# Patient Record
Sex: Female | Born: 1935 | Race: White | Hispanic: No | Marital: Married | Smoking: Never smoker
Health system: Southern US, Community
[De-identification: ages and names within clinical notes are randomized; demographics above are authoritative.]

## PROBLEM LIST (undated history)

## (undated) DIAGNOSIS — M75101 Unspecified rotator cuff tear or rupture of right shoulder, not specified as traumatic: Secondary | ICD-10-CM

## (undated) DIAGNOSIS — I2510 Atherosclerotic heart disease of native coronary artery without angina pectoris: Secondary | ICD-10-CM

## (undated) DIAGNOSIS — I714 Abdominal aortic aneurysm, without rupture, unspecified: Secondary | ICD-10-CM

## (undated) DIAGNOSIS — E785 Hyperlipidemia, unspecified: Secondary | ICD-10-CM

## (undated) DIAGNOSIS — I10 Essential (primary) hypertension: Secondary | ICD-10-CM

## (undated) HISTORY — PX: CORONARY ARTERY BYPASS GRAFT: SHX141

---

## 2004-09-11 ENCOUNTER — Ambulatory Visit: Payer: Self-pay | Admitting: Cardiology

## 2004-10-30 ENCOUNTER — Encounter: Payer: Self-pay | Admitting: Cardiology

## 2004-11-18 ENCOUNTER — Encounter: Payer: Self-pay | Admitting: Cardiology

## 2004-12-19 ENCOUNTER — Encounter: Payer: Self-pay | Admitting: Cardiology

## 2005-01-18 ENCOUNTER — Encounter: Payer: Self-pay | Admitting: Cardiology

## 2005-09-23 ENCOUNTER — Ambulatory Visit: Payer: Self-pay | Admitting: Obstetrics and Gynecology

## 2005-09-25 ENCOUNTER — Ambulatory Visit: Payer: Self-pay | Admitting: Obstetrics and Gynecology

## 2005-10-10 ENCOUNTER — Ambulatory Visit: Payer: Self-pay | Admitting: General Surgery

## 2005-10-24 ENCOUNTER — Ambulatory Visit: Payer: Self-pay | Admitting: General Surgery

## 2005-10-24 ENCOUNTER — Other Ambulatory Visit: Payer: Self-pay

## 2005-10-28 ENCOUNTER — Ambulatory Visit: Payer: Self-pay | Admitting: General Surgery

## 2005-11-06 ENCOUNTER — Ambulatory Visit: Payer: Self-pay | Admitting: Internal Medicine

## 2005-11-27 ENCOUNTER — Ambulatory Visit: Payer: Self-pay | Admitting: Oncology

## 2005-12-19 ENCOUNTER — Ambulatory Visit: Payer: Self-pay | Admitting: Oncology

## 2006-01-18 ENCOUNTER — Ambulatory Visit: Payer: Self-pay | Admitting: Oncology

## 2006-02-18 ENCOUNTER — Ambulatory Visit: Payer: Self-pay | Admitting: Oncology

## 2006-03-21 ENCOUNTER — Ambulatory Visit: Payer: Self-pay | Admitting: Oncology

## 2006-05-14 ENCOUNTER — Ambulatory Visit: Payer: Self-pay | Admitting: General Surgery

## 2006-05-20 ENCOUNTER — Ambulatory Visit: Payer: Self-pay | Admitting: Oncology

## 2006-05-26 ENCOUNTER — Ambulatory Visit: Payer: Self-pay | Admitting: Oncology

## 2006-06-19 ENCOUNTER — Ambulatory Visit: Payer: Self-pay | Admitting: Oncology

## 2006-08-19 ENCOUNTER — Ambulatory Visit: Payer: Self-pay | Admitting: Radiation Oncology

## 2006-09-18 ENCOUNTER — Ambulatory Visit: Payer: Self-pay | Admitting: Radiation Oncology

## 2006-09-19 ENCOUNTER — Ambulatory Visit: Payer: Self-pay | Admitting: Oncology

## 2006-09-22 ENCOUNTER — Ambulatory Visit: Payer: Self-pay | Admitting: Oncology

## 2006-10-20 ENCOUNTER — Ambulatory Visit: Payer: Self-pay | Admitting: Radiation Oncology

## 2006-10-20 ENCOUNTER — Ambulatory Visit: Payer: Self-pay | Admitting: Oncology

## 2006-11-19 ENCOUNTER — Ambulatory Visit: Payer: Self-pay | Admitting: Radiation Oncology

## 2006-11-19 ENCOUNTER — Ambulatory Visit: Payer: Self-pay | Admitting: Oncology

## 2006-12-20 ENCOUNTER — Ambulatory Visit: Payer: Self-pay | Admitting: Oncology

## 2006-12-26 ENCOUNTER — Ambulatory Visit: Payer: Self-pay | Admitting: Oncology

## 2007-01-19 ENCOUNTER — Ambulatory Visit: Payer: Self-pay | Admitting: Oncology

## 2007-01-20 ENCOUNTER — Ambulatory Visit: Payer: Self-pay | Admitting: Otolaryngology

## 2007-01-22 ENCOUNTER — Inpatient Hospital Stay: Payer: Self-pay | Admitting: Otolaryngology

## 2007-07-01 ENCOUNTER — Ambulatory Visit: Payer: Self-pay | Admitting: Oncology

## 2007-07-20 ENCOUNTER — Ambulatory Visit: Payer: Self-pay | Admitting: Oncology

## 2007-08-19 ENCOUNTER — Ambulatory Visit: Payer: Self-pay | Admitting: Oncology

## 2007-09-17 ENCOUNTER — Ambulatory Visit: Payer: Self-pay | Admitting: Oncology

## 2007-09-19 ENCOUNTER — Ambulatory Visit: Payer: Self-pay | Admitting: Oncology

## 2007-11-16 ENCOUNTER — Ambulatory Visit: Payer: Self-pay | Admitting: Oncology

## 2007-11-19 ENCOUNTER — Ambulatory Visit: Payer: Self-pay | Admitting: Oncology

## 2008-02-19 ENCOUNTER — Ambulatory Visit: Payer: Self-pay | Admitting: Oncology

## 2008-02-19 IMAGING — CR DG CHEST 1V
1 series · 1 of 1 positions shown · non-contrast
Comparison: none

REASON FOR EXAM: Post RIGHT breast biopsy upright EX Dr. Jhemboy
COMMENTS:

PROCEDURE:     DXR - DXR CHEST 1 VIEWAP OR PA  - October 10, 2005  [DATE]
RESULT:
HISTORY: Post RIGHT breast biopsy.

[view not recorded]
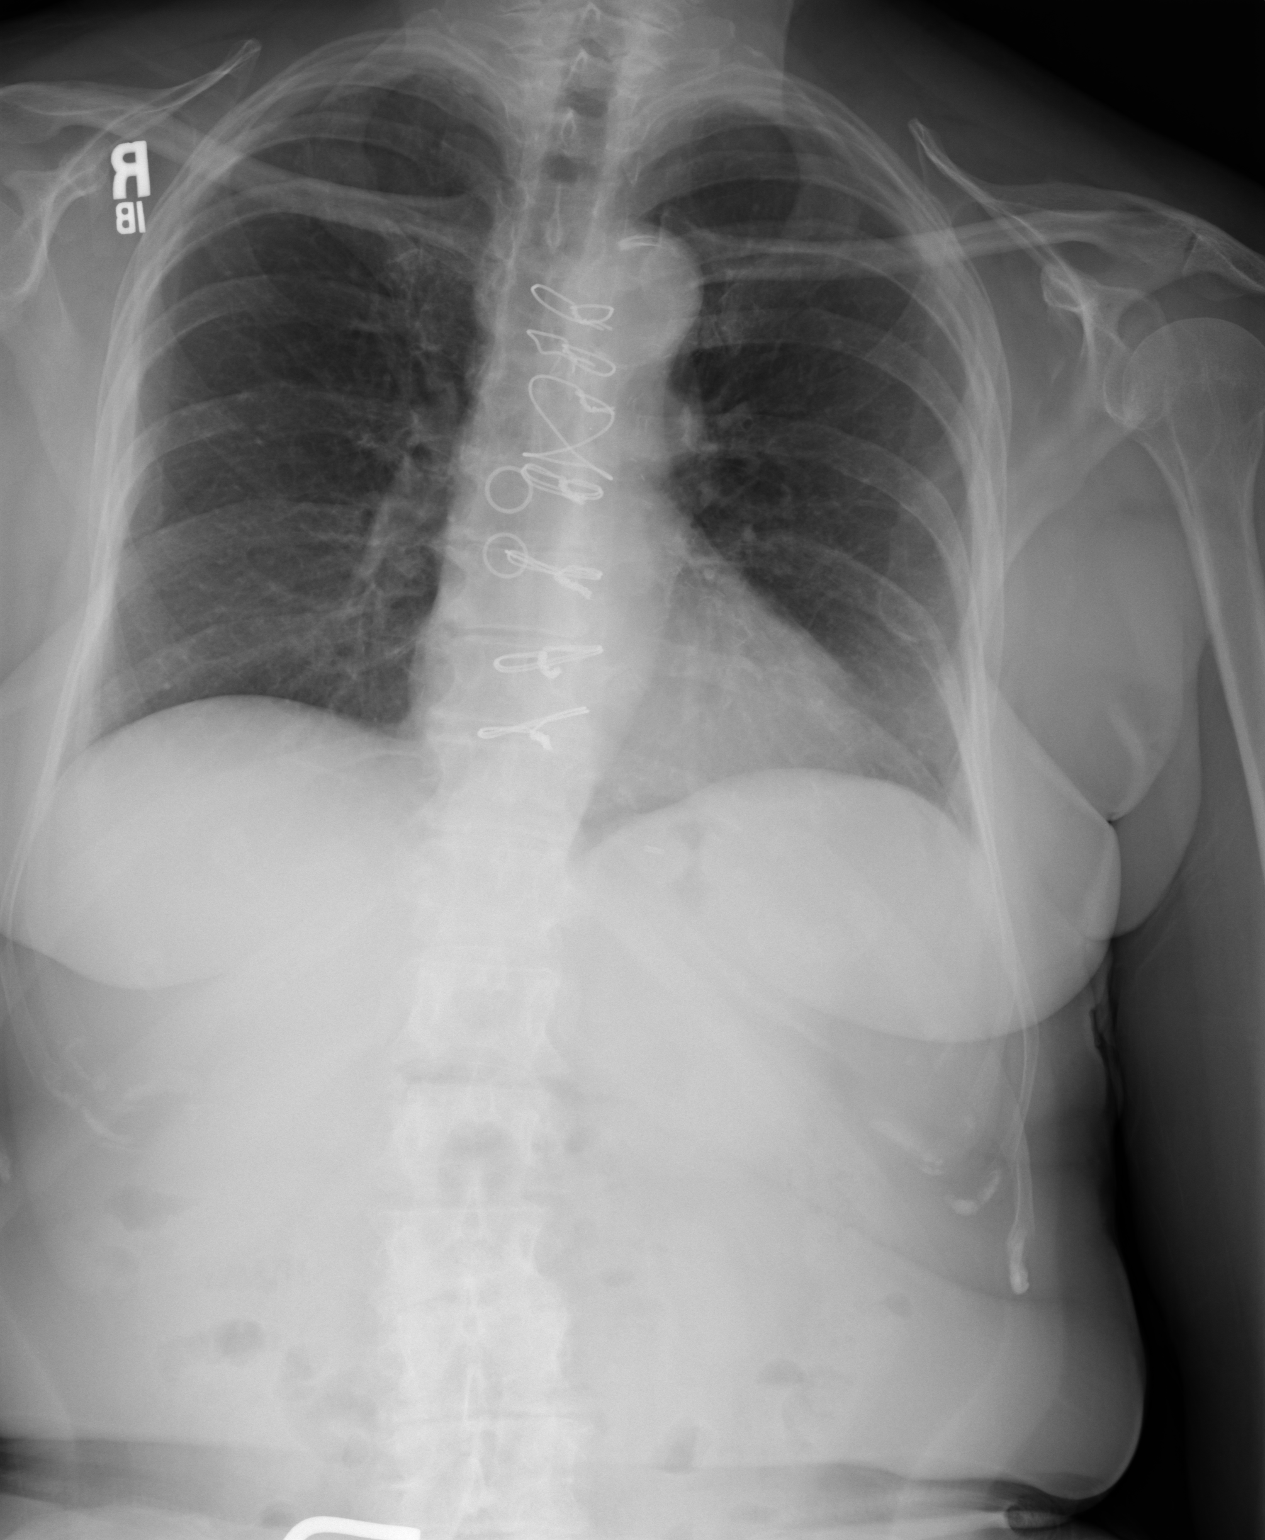

[1 of 1 positions shown; findings below may reference images not displayed]

FINDINGS: No evidence of pneumothorax post breast biopsy.  Chest x-ray was
obtained as the biopsy was performed to a breast lesion along the chest
wall.  The patient has had a prior CABG.
IMPRESSION: No evidence of pneumothorax post breast biopsy.

## 2008-02-29 ENCOUNTER — Ambulatory Visit: Payer: Self-pay | Admitting: Oncology

## 2008-03-21 ENCOUNTER — Ambulatory Visit: Payer: Self-pay | Admitting: Oncology

## 2008-08-18 ENCOUNTER — Ambulatory Visit: Payer: Self-pay | Admitting: Oncology

## 2008-08-29 ENCOUNTER — Ambulatory Visit: Payer: Self-pay | Admitting: Oncology

## 2008-09-18 ENCOUNTER — Ambulatory Visit: Payer: Self-pay | Admitting: Oncology

## 2008-12-14 ENCOUNTER — Ambulatory Visit: Payer: Self-pay | Admitting: Oncology

## 2009-02-18 ENCOUNTER — Ambulatory Visit: Payer: Self-pay | Admitting: Oncology

## 2009-03-09 ENCOUNTER — Ambulatory Visit: Payer: Self-pay | Admitting: Oncology

## 2009-03-21 ENCOUNTER — Ambulatory Visit: Payer: Self-pay | Admitting: Oncology

## 2009-12-12 ENCOUNTER — Ambulatory Visit: Payer: Self-pay | Admitting: Oncology

## 2009-12-19 ENCOUNTER — Ambulatory Visit: Payer: Self-pay | Admitting: Oncology

## 2009-12-21 ENCOUNTER — Ambulatory Visit: Payer: Self-pay | Admitting: Oncology

## 2009-12-29 LAB — CANCER ANTIGEN 27.29: CA 27.29: 26.1 U/mL (ref 0.0–38.6)

## 2010-01-18 ENCOUNTER — Ambulatory Visit: Payer: Self-pay | Admitting: Oncology

## 2010-11-27 ENCOUNTER — Ambulatory Visit: Payer: Self-pay | Admitting: Oncology

## 2010-11-28 LAB — CANCER ANTIGEN 27.29: CA 27.29: 43.7 U/mL — ABNORMAL HIGH (ref 0.0–38.6)

## 2010-12-20 ENCOUNTER — Ambulatory Visit: Payer: Self-pay | Admitting: Oncology

## 2011-01-19 ENCOUNTER — Ambulatory Visit: Payer: Self-pay | Admitting: Oncology

## 2011-02-01 ENCOUNTER — Inpatient Hospital Stay: Payer: Self-pay | Admitting: Orthopedic Surgery

## 2011-02-01 HISTORY — PX: ANKLE FRACTURE SURGERY: SHX122

## 2011-02-08 ENCOUNTER — Inpatient Hospital Stay (HOSPITAL_COMMUNITY)
Admission: AD | Admit: 2011-02-08 | Discharge: 2011-02-09 | DRG: 087 | Disposition: A | Discharge status: Skilled Nursing Facility | Payer: Medicare Other | Source: Other Acute Inpatient Hospital | Attending: Neurosurgery | Admitting: Neurosurgery

## 2011-02-08 ENCOUNTER — Emergency Department: Payer: Self-pay | Admitting: Unknown Physician Specialty

## 2011-02-08 ENCOUNTER — Encounter (HOSPITAL_COMMUNITY): Payer: Self-pay

## 2011-02-08 DIAGNOSIS — W19XXXA Unspecified fall, initial encounter: Secondary | ICD-10-CM | POA: Diagnosis present

## 2011-02-08 DIAGNOSIS — S065X9A Traumatic subdural hemorrhage with loss of consciousness of unspecified duration, initial encounter: Secondary | ICD-10-CM | POA: Diagnosis present

## 2011-02-08 DIAGNOSIS — IMO0001 Reserved for inherently not codable concepts without codable children: Secondary | ICD-10-CM

## 2011-02-08 DIAGNOSIS — S0100XA Unspecified open wound of scalp, initial encounter: Secondary | ICD-10-CM | POA: Diagnosis present

## 2011-02-08 DIAGNOSIS — S92009A Unspecified fracture of unspecified calcaneus, initial encounter for closed fracture: Secondary | ICD-10-CM | POA: Diagnosis present

## 2011-02-08 DIAGNOSIS — Z853 Personal history of malignant neoplasm of breast: Secondary | ICD-10-CM

## 2011-02-08 DIAGNOSIS — S065X0A Traumatic subdural hemorrhage without loss of consciousness, initial encounter: Principal | ICD-10-CM | POA: Diagnosis present

## 2011-02-08 DIAGNOSIS — Z951 Presence of aortocoronary bypass graft: Secondary | ICD-10-CM

## 2011-02-08 DIAGNOSIS — I2510 Atherosclerotic heart disease of native coronary artery without angina pectoris: Secondary | ICD-10-CM | POA: Diagnosis present

## 2011-02-08 HISTORY — DX: Unspecified rotator cuff tear or rupture of right shoulder, not specified as traumatic: M75.101

## 2011-02-08 HISTORY — DX: Atherosclerotic heart disease of native coronary artery without angina pectoris: I25.10

## 2011-02-08 HISTORY — DX: Abdominal aortic aneurysm, without rupture, unspecified: I71.40

## 2011-02-08 HISTORY — DX: Hyperlipidemia, unspecified: E78.5

## 2011-02-08 HISTORY — DX: Abdominal aortic aneurysm, without rupture: I71.4

## 2011-02-08 HISTORY — DX: Essential (primary) hypertension: I10

## 2011-02-08 LAB — MRSA PCR SCREENING: MRSA by PCR: NEGATIVE

## 2011-02-08 MED ORDER — METOPROLOL TARTRATE 50 MG PO TABS
50.0000 mg | ORAL_TABLET | Freq: Two times a day (BID) | ORAL | Status: DC
  Administered 2011-02-08 – 2011-02-09 (×2): 50 mg via ORAL
  Filled 2011-02-08 (×3): qty 1

## 2011-02-08 MED ORDER — LISINOPRIL 10 MG PO TABS
10.0000 mg | ORAL_TABLET | Freq: Every day | ORAL | Status: DC
  Administered 2011-02-08 – 2011-02-09 (×2): 10 mg via ORAL
  Filled 2011-02-08 (×2): qty 1

## 2011-02-08 MED ORDER — HYDROCODONE-ACETAMINOPHEN 5-325 MG PO TABS
1.0000 | ORAL_TABLET | ORAL | Status: DC | PRN
  Administered 2011-02-08 – 2011-02-09 (×7): 1 via ORAL
  Filled 2011-02-08 (×7): qty 1
  Filled 2011-02-08: qty 2

## 2011-02-08 MED ORDER — SODIUM CHLORIDE 0.9 % IJ SOLN
3.0000 mL | Freq: Two times a day (BID) | INTRAMUSCULAR | Status: DC
  Administered 2011-02-08 – 2011-02-09 (×3): 3 mL via INTRAVENOUS

## 2011-02-08 MED ORDER — METFORMIN HCL 500 MG PO TABS
500.0000 mg | ORAL_TABLET | Freq: Two times a day (BID) | ORAL | Status: DC
  Administered 2011-02-08 – 2011-02-09 (×2): 500 mg via ORAL
  Filled 2011-02-08 (×4): qty 1

## 2011-02-08 MED ORDER — SIMVASTATIN 20 MG PO TABS
20.0000 mg | ORAL_TABLET | Freq: Every day | ORAL | Status: DC
  Administered 2011-02-08: 20 mg via ORAL
  Filled 2011-02-08 (×2): qty 1

## 2011-02-08 MED ORDER — ACETAMINOPHEN 325 MG PO TABS
650.0000 mg | ORAL_TABLET | ORAL | Status: DC | PRN
  Administered 2011-02-09: 650 mg via ORAL
  Filled 2011-02-08: qty 2

## 2011-02-08 MED ORDER — INSULIN ASPART 100 UNIT/ML ~~LOC~~ SOLN
0.0000 [IU] | Freq: Three times a day (TID) | SUBCUTANEOUS | Status: DC
  Administered 2011-02-08: 3 [IU] via SUBCUTANEOUS
  Administered 2011-02-09: 2 [IU] via SUBCUTANEOUS
  Administered 2011-02-09: 3 [IU] via SUBCUTANEOUS
  Filled 2011-02-08: qty 3

## 2011-02-08 MED ORDER — ONDANSETRON HCL 4 MG/2ML IJ SOLN: 4.0000 mg | Freq: Four times a day (QID) | INTRAMUSCULAR | Status: DC | PRN

## 2011-02-08 MED ORDER — INSULIN ASPART 100 UNIT/ML ~~LOC~~ SOLN
0.0000 [IU] | Freq: Every day | SUBCUTANEOUS | Status: DC
  Filled 2011-02-08: qty 3

## 2011-02-08 MED ORDER — SODIUM CHLORIDE 0.9 % IJ SOLN: 3.0000 mL | INTRAMUSCULAR | Status: DC | PRN

## 2011-02-08 MED ORDER — SODIUM CHLORIDE 0.9 % IV SOLN: 250.0000 mL | INTRAVENOUS | Status: DC | PRN

## 2011-02-08 MED ORDER — INFLUENZA VIRUS VACC SPLIT PF IM SUSP
0.5000 mL | INTRAMUSCULAR | Status: AC
  Administered 2011-02-09: 0.5 mL via INTRAMUSCULAR
  Filled 2011-02-08: qty 0.5

## 2011-02-08 MED ORDER — EZETIMIBE 10 MG PO TABS
10.0000 mg | ORAL_TABLET | Freq: Every day | ORAL | Status: DC
  Administered 2011-02-08 – 2011-02-09 (×2): 10 mg via ORAL
  Filled 2011-02-08 (×2): qty 1

## 2011-02-08 MED ORDER — TRIAMTERENE-HCTZ 37.5-25 MG PO TABS
1.0000 | ORAL_TABLET | Freq: Every day | ORAL | Status: DC
  Administered 2011-02-08 – 2011-02-09 (×2): 1 via ORAL
  Filled 2011-02-08 (×2): qty 1

## 2011-02-08 MED ORDER — ONDANSETRON HCL 4 MG PO TABS: 4.0000 mg | ORAL_TABLET | Freq: Four times a day (QID) | ORAL | Status: DC | PRN

## 2011-02-08 MED ORDER — LETROZOLE 2.5 MG PO TABS
2.5000 mg | ORAL_TABLET | Freq: Every day | ORAL | Status: DC
  Administered 2011-02-08 – 2011-02-09 (×2): 2.5 mg via ORAL
  Filled 2011-02-08 (×2): qty 1

## 2011-02-08 NOTE — Progress Notes (Signed)
Per Dr. Jordan Likes 02/08/11 @ 09:15:  Pt to continue all home meds except aspirin.

## 2011-02-08 NOTE — H&P (Signed)
Sonya Hoover is an 76 y.o. female.   Chief Complaint: Status post fall HPI: 76 year old woman who was convalescing in a rehabilitation unit in [place]. She suffered a fall early this morning striking the occipital region of her head. She had no obvious loss of consciousness is but she was amnestic to the event. Currently she complains of minimal headache. She denies any numbness paresthesias or weakness. She's had no seizure. She's had no evidence of hemodynamic instability. She has no evidence of other injuries aside from her left foot and ankle fracture.  Past medical history is notable for history of breast carcinoma without any known active disease. She history of coronary artery disease status post coronary bypass grafting. She has had a recent left calcaneus fracture secondary to a fall and is status post ORIF of this fracture.  No past medical history on file.  No past surgical history on file.  No family history on file. Social History:  does not have a smoking history on file. She does not have any smokeless tobacco history on file. Her alcohol and drug histories not on file.  Allergies: Allergies not on file  No current facility-administered medications on file as of 02/08/2011.   No current outpatient prescriptions on file as of 02/08/2011.    No results found for this or any previous visit (from the past 48 hour(s)). No results found.  Review of Systems  All other systems reviewed and are negative.    There were no vitals taken for this visit. Physical Exam  Nursing note and vitals reviewed. Constitutional: She is oriented to person, place, and time. She appears well-developed and well-nourished. No distress.  HENT:  Right Ear: External ear normal.  Left Ear: External ear normal.  Nose: Nose normal.  Mouth/Throat: Oropharynx is clear and moist.       Occipital laceration closed with staples. No gross bony abnormalities.  Eyes: Conjunctivae and EOM are normal.  Pupils are equal, round, and reactive to light. Right eye exhibits no discharge. Left eye exhibits no discharge.  Neck: Normal range of motion. Neck supple. No tracheal deviation present. No thyromegaly present.  Cardiovascular: Normal rate, regular rhythm, normal heart sounds and intact distal pulses.   Respiratory: Effort normal and breath sounds normal. No respiratory distress. She has no wheezes.  GI: Soft. Bowel sounds are normal. She exhibits no distension. There is no tenderness.  Musculoskeletal: Normal range of motion. She exhibits no edema and no tenderness.       Left foot and ankle immobilized in the cast.  Neurological: She is alert and oriented to person, place, and time. She has normal reflexes. She displays normal reflexes. No cranial nerve deficit. She exhibits normal muscle tone. Coordination normal.  Skin: Skin is warm and dry. No rash noted. She is not diaphoretic. No erythema. No pallor.  Psychiatric: She has a normal mood and affect. Her behavior is normal. Judgment and thought content normal.     Assessment/Plan Patient has a small interhemispheric subdural hematoma secondary to her fall. There is noted subcortical contusion or other significant parenchymal injury. Plan is to admit and observe. Gait followup head CT scan in morning. If head CT scan stable and patient can be transferred back to her rehabilitation unit.  Sonya Hoover A 02/08/2011, 9:19 AM

## 2011-02-09 ENCOUNTER — Inpatient Hospital Stay (HOSPITAL_COMMUNITY): Payer: Medicare Other

## 2011-02-09 LAB — BASIC METABOLIC PANEL
BUN: 26 mg/dL — ABNORMAL HIGH (ref 6–23)
CO2: 24 mEq/L (ref 19–32)
Calcium: 9.4 mg/dL (ref 8.4–10.5)
GFR calc non Af Amer: 41 mL/min — ABNORMAL LOW (ref >90)
Glucose, Bld: 148 mg/dL — ABNORMAL HIGH (ref 70–99)
Sodium: 131 mEq/L — ABNORMAL LOW (ref 135–145)

## 2011-02-09 LAB — CBC
HCT: 30.3 % — ABNORMAL LOW (ref 36.0–46.0)
Hemoglobin: 9.8 g/dL — ABNORMAL LOW (ref 12.0–15.0)
MCH: 28.1 pg (ref 26.0–34.0)
MCHC: 32.3 g/dL (ref 30.0–36.0)
RBC: 3.49 MIL/uL — ABNORMAL LOW (ref 3.87–5.11)

## 2011-02-09 LAB — GLUCOSE, CAPILLARY
Glucose-Capillary: 143 mg/dL — ABNORMAL HIGH (ref 70–99)
Glucose-Capillary: 172 mg/dL — ABNORMAL HIGH (ref 70–99)

## 2011-02-09 NOTE — Progress Notes (Signed)
CSW received referral from [place], [facility], to assist with pt return to SNF. Pt agreeable to return but reports she does not want to take Ambien; reports fall occurred after being given [place] and she is unable to recall the incident. CSW updated SNF. RN to update pt's dtr.  Pt will return via [facility]. No other CSW needs reported or noted. CSW signing off. Dellie Burns, MSW, ZOXWR [phone] (weekend cover)

## 2011-02-09 NOTE — Progress Notes (Signed)
Subjective: Patient reports Patient is doing very well with very minimal headache no nausea no vomiting tolerating regular diet  Objective: Vital signs in last 24 hours: Temp:  [97.8 F (36.6 C)-99 F (37.2 C)] 98.2 F (36.8 C) (12/22 0000) Pulse Rate:  [61-86] 62  (12/22 0600) Resp:  [12-19] 14  (12/22 0600) BP: (107-134)/(41-67) 109/46 mmHg (12/22 0600) SpO2:  [91 %-100 %] 91 % (12/22 0600) Weight:  [69.5 kg (153 lb 3.5 oz)] 153 lb 3.5 oz (69.5 kg) (12/21 0900)  Intake/Output from previous day: 12/21 0701 - 12/22 0700 In: 1130 [P.O.:1130] Out: 1200 [Urine:1200] Intake/Output this shift:    She is awake she is alert sure at x4 cranial nerves are intact strength out of 5 her upper cavity is her lower Sherry strength out of 5 as much as is limited by the brace is on her left foot.  Lab Results:  [facility] 02/09/11 0330  WBC 7.7  HGB 9.8*  HCT 30.3*  PLT 318   BMET  Basename 02/09/11 0330  NA 131*  K 4.6  CL 96  CO2 24  GLUCOSE 148*  BUN 26*  CREATININE 1.27*  CALCIUM 9.4    Studies/Results: Ct Head Wo Contrast  02/09/2011  *RADIOLOGY REPORT*  Clinical Data: Status post fall; hit occiput.  Minimal headache and short-term amnesia.  CT HEAD WITHOUT CONTRAST  Technique:  Contiguous axial images were obtained from the base of the skull through the vertex without contrast.  Comparison: None.  Findings: There is no evidence of acute infarction, mass lesion, or intra- or extra-axial hemorrhage on CT.  Prominence of the ventricles and sulci reflects mild cortical volume loss.  Mild cerebellar atrophy is noted.  Diffuse periventricular and subcortical white matter change likely reflects small vessel ischemic microangiopathy.  Chronic lacunar infarcts are noted within the basal ganglia bilaterally.  The brainstem and fourth ventricle are within normal limits.  The cerebral hemispheres demonstrate grossly normal gray-white differentiation.  No mass effect or midline shift is seen.   There is no evidence of fracture; visualized osseous structures are unremarkable in appearance.  The visualized portions of the orbits are within normal limits.  The paranasal sinuses and mastoid air cells are well-aerated.  Mild soft tissue swelling is noted along the occiput and at the vertex, with overlying skin staples.  IMPRESSION:  1.  No evidence of traumatic intracranial injury or fracture. 2.  Mild soft tissue swelling along the occiput and at the vertex, with overlying skin staples. 3.  Mild cortical volume loss and diffuse small vessel ischemic microangiopathy; chronic lacunar infarcts within the basal ganglia bilaterally.  Original Report Authenticated By: Tonia Ghent, M.D.    Assessment/Plan: Patient stable to be transferred back to her rehabilitation unit [facility] will contact social worker to be coming to happen to her followup CT scan was stable  LOS: 1 day     Avish Torry P 02/09/2011, 8:00 AM

## 2011-02-09 NOTE — Progress Notes (Signed)
Physical Therapy Evaluation Patient Details Name: Sonya Hoover MRN: [id number] DOB: 03/06/1934 Today's Date: 02/09/2011  Problem List:  Patient Active Problem List  Diagnoses  . Subdural hematoma, post-traumatic  . Calcaneus fracture    Past Medical History:  Past Medical History  Diagnosis Date  . Coronary artery disease   . Diabetes mellitus   . Hypertension   . Hyperlipidemia   . Abdominal aortic aneurysm   . Rotator cuff tear, right    Past Surgical History:  Past Surgical History  Procedure Date  . Coronary artery bypass graft     2006 @ [facility]  . Ankle fracture surgery 02/01/11    [facility]    PT Assessment/Plan/Recommendation PT Assessment Clinical Impression Statement: Patient presents with SDH status post fall at SNF where she was receiving PT for a recent fall resulting in a calcaneus fracture. Patient will require PT in the acute care setting to ensure patient continues with her rehab to avoid a decline in her functional abilities PT Recommendation/Assessment: Patient will need skilled PT in the acute care venue PT Problem List: Decreased strength;Decreased activity tolerance;Decreased balance;Decreased mobility;Pain;Decreased knowledge of use of DME PT Therapy Diagnosis : Difficulty walking;Acute pain;Generalized weakness PT Plan PT Frequency: Min 3X/week PT Treatment/Interventions: DME instruction;Gait training;Functional mobility training;Therapeutic activities;Therapeutic exercise;Balance training;Patient/family education PT Recommendation Follow Up Recommendations: Skilled nursing facility Equipment Recommended: Defer to next venue PT Goals  Acute Rehab PT Goals PT Goal Formulation: With patient Time For Goal Achievement: 2 weeks Pt will go Supine/Side to Sit: with modified independence PT Goal: Supine/Side to Sit - Progress: Not met Pt will go Sit to Supine/Side: with modified independence PT Goal: Sit to Supine/Side -  Progress: Not met Pt will go Sit to Stand: with modified independence PT Goal: Sit to Stand - Progress: Not met Pt will go Stand to Sit: with modified independence PT Goal: Stand to Sit - Progress: Not met Pt will Transfer Bed to Chair/Chair to Bed: with modified independence PT Transfer Goal: Bed to Chair/Chair to Bed - Progress: Not met Pt will Stand: with modified independence;1 - 2 min;with unilateral upper extremity support PT Goal: Stand - Progress: Not met Pt will Ambulate: 1 - 15 feet;with min assist;with rolling walker PT Goal: Ambulate - Progress: Not met  PT Evaluation Precautions/Restrictions  Precautions Precautions: Fall Restrictions Weight Bearing Restrictions: Yes LLE Weight Bearing: Non weight bearing Other Position/Activity Restrictions: Per patient surgery one week ago. Has taken approx 13 hops thus far with rehab. Prior Functioning  Home Living Lives With: Alone (until recent discharge to SNF post calcaneus fracture) Prior Function Level of Independence: Independent with basic ADLs;Independent with homemaking with ambulation (Prior to calcaneus fracture) Driving: Yes Cognition Cognition Arousal/Alertness: Awake/alert Overall Cognitive Status: Appears within functional limits for tasks assessed Orientation Level: Oriented X4 Sensation/Coordination Sensation Light Touch: Appears Intact Extremity Assessment RLE Assessment RLE Assessment: Within Functional Limits LLE Assessment LLE Assessment: Exceptions to WFL LLE AROM (degrees) Overall AROM Left Lower Extremity: Within functional limits for tasks assessed LLE Overall AROM Comments: with the exception of foot/ankle which was not assessed secondary to cast. LLE Strength LLE Overall Strength:  (Grossly 4/5 at hip and knee) Mobility (including Balance) Bed Mobility Bed Mobility: Yes Supine to Sit: 4: Min assist;HOB elevated (Comment degrees) Supine to Sit Details (indicate cue type and reason): 45 degrees  - Patient able to move left lower extremity well. Needs assistance to initiate trunk Sitting - Scoot to [place] of Bed: 5: Supervision;With rail Sitting -  Scoot to [place] of Bed Details (indicate cue type and reason): Patient utilizes rail at foot of bed. Transfers Transfers: Yes Sit to Stand: 4: Min assist;From bed;From chair/3-in-1;With upper extremity assist;With armrests Sit to Stand Details (indicate cue type and reason): Patient able to initiate stand well NWB R-LE - needs upper extremity suppot to stabilize Stand to Sit: 4: Min assist;With upper extremity assist;To chair/3-in-1 Stand to Sit Details: To control descent. Stand Pivot Transfers: 4: Min assist Stand Pivot Transfer Details (indicate cue type and reason): From bed to 3 in 1 then to recliner. Patient needs assistance mostly for balance/safety and to manage lines and leads so patient does not get tangled in them. NWB R-LE  Static Standing Balance Static Standing - Balance Support: Right upper extremity supported Static Standing - Level of Assistance: 5: Stand by assistance Static Standing - Comment/# of Minutes: Assist for toilet hygiene while patient stands without physical assistance End of Session PT - End of Session Equipment Utilized During Treatment: Gait belt Activity Tolerance: Patient tolerated treatment well Patient left: in chair;with call bell in reach Nurse Communication: Mobility status for transfers General Behavior During Session: [facility] for tasks performed Cognition: [facility] for tasks performed  Edwyna Perfect, PT  Pager [phone]  02/09/2011, 10:12 AM

## 2011-02-09 NOTE — Discharge Summary (Signed)
Physician Discharge Summary  Patient ID: Sonya Hoover MRN: [id number] DOB/AGE: [birth year] 76 y.o.  Admit date: 02/08/2011 Discharge date: 02/09/2011  Admission Diagnoses:subdural hematoma Discharge Diagnoses: Same Principal Problem:  *Subdural hematoma, post-traumatic Active Problems:  Calcaneus fracture   Discharged Condition: good  Hospital Course: Patient was admitted on transfer from the rehabilitation unit [facility] for observation after a fall and a subdural hematoma patient is a stable neurologically with stable followup CT scan and they'll be transferred back to rehabilitation  Consults: none  Significant Diagnostic Studies: radiology: CT scan: Stable subdural hematoma for parafalcine  Treatments:   Discharge Exam: Blood pressure 120/63, pulse 74, temperature 98.4 F (36.9 C), temperature source Oral, resp. rate 19, height 5' 2.6" (1.59 m), weight 69.5 kg (153 lb 3.5 oz), SpO2 98.00%. Awake alert and oriented cranial nerves are intact neck is 5 out of 5 with no pronator drift  Disposition: Final discharge disposition not confirmed   Medication List  As of 02/09/2011  9:43 AM   ASK your doctor about these medications         aspirin EC 325 MG tablet      calcium-vitamin D 500-200 MG-UNIT per tablet   Commonly known as: OSCAL WITH D      celecoxib 200 MG capsule   Commonly known as: CELEBREX      * diphenhydrAMINE 25 MG tablet   Commonly known as: BENADRYL      * diphenhydrAMINE 25 MG tablet   Commonly known as: BENADRYL      docusate calcium 240 MG capsule   Commonly known as: SURFAK      docusate sodium 100 MG capsule   Commonly known as: COLACE      ezetimibe 10 MG tablet   Commonly known as: ZETIA      ferrous sulfate 325 (65 FE) MG tablet      HYDROcodone-acetaminophen 5-325 MG per tablet   Commonly known as: NORCO      insulin regular 100 units/mL injection   Commonly known as: NOVOLIN R,HUMULIN R      lisinopril 10 MG tablet   Commonly known as: PRINIVIL,ZESTRIL      metformin 500 MG (OSM) 24 hr tablet   Commonly known as: FORTAMET      metoprolol 50 MG tablet   Commonly known as: LOPRESSOR      psyllium 28 % packet   Commonly known as: METAMUCIL SMOOTH TEXTURE      senna 8.6 MG tablet   Commonly known as: SENOKOT      simvastatin 40 MG tablet   Commonly known as: ZOCOR      triamterene-hydrochlorothiazide 37.5-25 MG per tablet   Commonly known as: MAXZIDE-25      zolpidem 10 MG tablet   Commonly known as: AMBIEN     * Notice: This list has 2 medication(s) that are the same as other medications prescribed for you. Read the directions carefully, and ask your doctor or other care provider to review them with you.           Signed: Haille Pardi P 02/09/2011, 9:43 AM

## 2011-12-09 ENCOUNTER — Ambulatory Visit: Payer: Self-pay | Admitting: Oncology

## 2011-12-09 LAB — CBC CANCER CENTER
Basophil %: 1 %
Eosinophil %: 2.9 %
HCT: 38.2 % (ref 35.0–47.0)
HGB: 12.5 g/dL (ref 12.0–16.0)
Lymphocyte %: 31.6 %
MCHC: 32.7 g/dL (ref 32.0–36.0)
Monocyte %: 10.9 %
Neutrophil #: 4.3 x10 3/mm (ref 1.4–6.5)
Neutrophil %: 53.6 %
RBC: 4.28 10*6/uL (ref 3.80–5.20)

## 2011-12-09 LAB — COMPREHENSIVE METABOLIC PANEL
Alkaline Phosphatase: 81 U/L (ref 50–136)
Calcium, Total: 9 mg/dL (ref 8.5–10.1)
Chloride: 100 mmol/L (ref 98–107)
Co2: 22 mmol/L (ref 21–32)
EGFR (African American): 41 — ABNORMAL LOW
EGFR (Non-African Amer.): 35 — ABNORMAL LOW
SGOT(AST): 20 U/L (ref 15–37)
SGPT (ALT): 23 U/L (ref 12–78)
Sodium: 134 mmol/L — ABNORMAL LOW (ref 136–145)

## 2011-12-10 LAB — CANCER ANTIGEN 27.29: CA 27.29: 40 U/mL — ABNORMAL HIGH (ref 0.0–38.6)

## 2011-12-20 ENCOUNTER — Ambulatory Visit: Payer: Self-pay | Admitting: Oncology

## 2012-01-19 ENCOUNTER — Ambulatory Visit: Payer: Self-pay | Admitting: Oncology

## 2012-03-26 ENCOUNTER — Inpatient Hospital Stay: Payer: Self-pay | Admitting: General Practice

## 2012-03-26 LAB — COMPREHENSIVE METABOLIC PANEL
Albumin: 3.4 g/dL (ref 3.4–5.0)
Alkaline Phosphatase: 144 U/L — ABNORMAL HIGH (ref 50–136)
Anion Gap: 7 (ref 7–16)
BUN: 16 mg/dL (ref 7–18)
Bilirubin,Total: 0.5 mg/dL (ref 0.2–1.0)
Calcium, Total: 9.5 mg/dL (ref 8.5–10.1)
Co2: 26 mmol/L (ref 21–32)
EGFR (African American): 49 — ABNORMAL LOW
EGFR (Non-African Amer.): 42 — ABNORMAL LOW
Glucose: 246 mg/dL — ABNORMAL HIGH (ref 65–99)
Sodium: 132 mmol/L — ABNORMAL LOW (ref 136–145)

## 2012-03-26 LAB — CBC
HCT: 37.4 % (ref 35.0–47.0)
HGB: 12.7 g/dL (ref 12.0–16.0)
MCV: 87 fL (ref 80–100)
Platelet: 248 10*3/uL (ref 150–440)
RBC: 4.31 10*6/uL (ref 3.80–5.20)
WBC: 10.7 10*3/uL (ref 3.6–11.0)

## 2012-03-26 LAB — SEDIMENTATION RATE: Erythrocyte Sed Rate: 81 mm/hr — ABNORMAL HIGH (ref 0–30)

## 2012-03-26 LAB — HEMOGLOBIN A1C: Hemoglobin A1C: 7.3 % — ABNORMAL HIGH (ref 4.2–6.3)

## 2012-03-28 LAB — CBC WITH DIFFERENTIAL/PLATELET
Basophil %: 0.9 %
HCT: 30.4 % — ABNORMAL LOW (ref 35.0–47.0)
HGB: 10.3 g/dL — ABNORMAL LOW (ref 12.0–16.0)
Lymphocyte #: 3.2 10*3/uL (ref 1.0–3.6)
Lymphocyte %: 35.2 %
MCHC: 33.8 g/dL (ref 32.0–36.0)
MCV: 87 fL (ref 80–100)
Monocyte #: 1.1 x10 3/mm — ABNORMAL HIGH (ref 0.2–0.9)
Monocyte %: 12.5 %
Neutrophil %: 48.3 %
Platelet: 249 10*3/uL (ref 150–440)

## 2012-03-28 LAB — GRAM STAIN

## 2012-03-29 LAB — BASIC METABOLIC PANEL
BUN: 25 mg/dL — ABNORMAL HIGH (ref 7–18)
Co2: 22 mmol/L (ref 21–32)
Creatinine: 1.31 mg/dL — ABNORMAL HIGH (ref 0.60–1.30)
EGFR (African American): 46 — ABNORMAL LOW
Glucose: 208 mg/dL — ABNORMAL HIGH (ref 65–99)
Osmolality: 284 (ref 275–301)
Sodium: 137 mmol/L (ref 136–145)

## 2012-04-01 LAB — CULTURE, BLOOD (SINGLE)

## 2012-06-08 ENCOUNTER — Ambulatory Visit: Payer: Self-pay | Admitting: Oncology

## 2014-06-10 NOTE — Op Note (Signed)
PATIENT NAME:  Sonya SchwabDGE, [name] MR#:  [id number] DATE OF BIRTH:  06-Apr-1934  DATE OF PROCEDURE:  03/31/2012  POSTOPERATIVE DIAGNOSIS: Cellulitis, right leg.   POSTOPERATIVE DIAGNOSIS: Cellulitis, right leg.  PROCEDURES:  1. Ultrasound guidance for vascular access to left brachial vein.  2. Fluoroscopic guidance for placement of catheter.  3. Insertion of a single-lumen 4-French Morpheus PICC line, left arm.   SURGEON: Levora DredgeGregory Skyla Champagne, MD  ANESTHESIA: Local.   ESTIMATED BLOOD LOSS: Minimal.   INDICATION FOR PROCEDURE: Requiring intravenous antibiotics for greater than 5 days.  DESCRIPTION OF PROCEDURE: The patient's left arm was sterilely prepped and draped, and a sterile surgical field was created. The brachial vein was accessed under direct ultrasound guidance without difficulty with a micropuncture needle and permanent image was recorded. 0.018 wire was then placed into the superior vena cava. Peel-away sheath was placed over the wire. A single lumen peripherally inserted central venous catheter was then placed over the wire and the wire and peel-away sheath were removed. The catheter tip was placed into the superior vena cava and was secured at the skin at 39 cm with a sterile dressing. The catheter withdrew blood well and flushed easily with heparinized saline. The patient tolerated procedure well. ____________________________ Renford DillsGregory G. Ivis Nicolson, MD ggs:sb D: 04/03/2012 10:51:12 ET    T: 04/03/2012 12:15:20 ET        JOB#: [id number] cc: Renford DillsGregory G. Jayton Popelka, MD, <Dictator> Stann Mainlandavid P. Sampson GoonFitzgerald, MD Van ClinesJon Wolfe, PA Renford DillsGREGORY G Atley Scarboro MD ELECTRONICALLY SIGNED 04/06/2012 23:27

## 2014-06-10 NOTE — H&P (Signed)
Subjective/Chief Complaint Right ankle & foot pain    History of Present Illness 80 year old female reports the spontaneous onset of right foot and ankle pain since yesterday. She denied any trauma or aggravating event. She noted some redness and swelling that increased overnight. The pain had increased to the point that she was unable to bear weight on the right lower extremity. She was treated earlier this week for bronchitis and a right ear infection, receiving penicillin and levaquin. She states that she felt feverish this week, but thought it was related to the ear/URI issues.  The right ankle was aspirated by the ER physician and a preliminary Gram stain apparently demonstrated rare GPC.   Past Med/Surgical Hx:  Intracranial hemorrhage: 01/2011:   Dyslipidemia:   Diabetes:   Aortic aneursym:   Thyroid Nodule:   Hypertension:   Breast Cancer:   ORIF of left ankle fracture:   Lumpectomy:   Hernia Repair:   Colectomy/partial:   Hysterectomy:   CABG (Coronary Artery Bypass Graft):   ALLERGIES:  Ambien: Unknown  HOME MEDICATIONS: Medication Instructions Status  levofloxacin 750 mg oral tablet 1 tab(s) orally once a day Active  Zocor 40 mg oral tablet 1 tab(s) orally once a day (at bedtime) Active  aspirin 325 mg oral tablet 1 tab(s) orally once a day Active  hydrochlorothiazide-triamterene 25 mg-37.5 mg oral capsule 1 cap(s) orally once a day Active  benzonatate 100 mg oral capsule 1 cap(s) orally every 8 hours, As Needed for cough Active  metoprolol 50 mg oral tablet 1 tab(s) orally 2 times a day Active  lisinopril 10 mg oral tablet 1 tab(s) orally once a day (in the morning) Active   Family and Social History:   Family History Coronary Artery Disease  Diabetes Mellitus  Stroke    Social History negative tobacco, negative ETOH, negative Illicit drugs    Place of Living Home   Review of Systems:   Cough Yes    Sputum No    Abdominal Pain No    Diarrhea No     Constipation No    Nausea/Vomiting No    SOB/DOE No    Chest Pain No    Dysuria No   Physical Exam:   GEN well developed, well nourished, no acute distress    HEENT PERRL, Oropharynx clear    NECK supple    RESP normal resp effort  clear BS  no use of accessory muscles    CARD regular rate  no murmur  No LE edema  no JVD    ABD denies tenderness  soft  normal BS    LYMPH negative neck, negative axillae    EXTR Right ankle & foot: Localized swelling to the ankle with some swelling to the dorsum of the foot. Mild erythema. Gentle ankle range of motion well tolerated.    NEURO motor/sensory function intact    PSYCH alert, A+O to time, place, person, good insight   Lab Results: BF Analysis:  06-Feb-14 14:00    Body Fluid Source (CC) SYNOVIAL JOINT   Crystals (BF) NONE SEEN - - - - - - - - - - - - -  Crystals due to injection of intra-articular steroids can be present for up to two weeks following injection.  These crystals may be identical in appearance and polarization characteristics to those present incrystal-induced synovitis, such as monosodium urate and calcium pyrophosphate dehydrate crystals.  Clinical correlation is suggested when crystals are reported after injection of steroids.  Hepatic:  06-Feb-14 12:18    Bilirubin, Total 0.5   Alkaline Phosphatase  144   SGPT (ALT) 30   SGOT (AST)  45   Total Protein, Serum  8.3   Albumin, Serum 3.4  Routine Micro:  06-Feb-14 14:00    Micro Text Report GRAM STAIN   GRAM STAIN                GRAM POSITIVE COCCI   ANTIBIOTIC                        Gram Stain 1 GRAM POSITIVE COCCI  Routine Chem:  06-Feb-14 12:18    Hemoglobin A1c ([facility])  7.3 (The American Diabetes Association recommends that a primary goal of therapy should be <7% and that physicians should reevaluate the treatment regimen in patients with HbA1c values consistently >8%.)   Glucose, Serum  246   BUN 16   Creatinine (comp) 1.25   Sodium, Serum   132   Potassium, Serum 3.7   Chloride, Serum 99   CO2, Serum 26   Calcium (Total), Serum 9.5   Osmolality (calc) 274   eGFR (African American)  49   eGFR (Non-African American)  42 (eGFR values <33mL/min/1.73 m2 may be an indication of chronic kidney disease (CKD). Calculated eGFR is useful in patients with stable renal function. The eGFR calculation will not be reliable in acutely ill patients when serum creatinine is changing rapidly. It is not useful in  patients on dialysis. The eGFR calculation may not be applicable to patients at the low and high extremes of body sizes, pregnant women, and vegetarians.)   Anion Gap 7   Uric Acid, Serum  6.9 (Result(s) reported on 26 Mar 2012 at 12:43PM.)    14:00    Result Comment gram stain - ordered per dr. Marry Guan request. verified  - by jb. mpg 03/26/12.  Result(s) reported on 26 Mar 2012 at 07:16PM.  Routine Hem:  06-Feb-14 12:18    Erythrocyte Sed Rate  81 (Result(s) reported on 26 Mar 2012 at 03:15PM.)   WBC (CBC) 10.7   RBC (CBC) 4.31   Hemoglobin (CBC) 12.7   Hematocrit (CBC) 37.4   Platelet Count (CBC) 248 (Result(s) reported on 26 Mar 2012 at 12:30PM.)   MCV 87   MCH 29.6   MCHC 34.1   RDW 12.5   Radiology Results: XRay:    06-Feb-14 12:32, Foot Right Complete   Foot Right Complete   REASON FOR EXAM:    swelling, pain  COMMENTS:       PROCEDURE: DXR - DXR FOOT RT COMPLETE W/OBLIQUES  - Mar 26 2012 12:32PM     RESULT: Comparison: None.    Findings:  No acute fracture. No cortical thickening or periostitis.    IMPRESSION:   No acute findings.    Dictation site: 2    Verified By: Gregor Hams, M.D., MD     Assessment/Admission Diagnosis Right ankle & foot cellulitis; possible septic joint    Plan Findings discussed in detail with the patient and her daughter. Continue antibiotics. Will reassess in AM. Possible reaspiration vs. irrigation & debridement. Consult Dr. Ola Spurr (Infectious disease) in AM.    Electronic Signatures: Dereck Leep (MD)  (Signed 06-Feb-14 20:53)  Authored: CHIEF COMPLAINT and HISTORY, PAST MEDICAL/SURGIAL HISTORY, ALLERGIES, HOME MEDICATIONS, FAMILY AND SOCIAL HISTORY, REVIEW OF SYSTEMS, PHYSICAL EXAM, LABS, Radiology, ASSESSMENT AND PLAN   Last Updated: 06-Feb-14 20:53 by Dereck Leep (  MD) 

## 2014-06-10 NOTE — Consult Note (Signed)
PATIENT NAME:  Sonya Hoover, Sonya Hoover MR#:  [id number] DATE OF BIRTH:  1934-11-21  DATE OF CONSULTATION:  03/27/2012  REFERRING PHYSICIAN:  Skip Estimable, MD CONSULTING PHYSICIAN:  Cheral Marker. Ola Spurr, MD  PRIMARY CARE PHYSICIAN: [place] Pink, MD  REASON FOR CONSULTATION: Evaluation of right ankle septic arthritis.   HISTORY OF PRESENT ILLNESS: This is a very pleasant 80 year old female with a history of coronary artery disease status post CABG and breast cancer status post treatment and relatively well controlled diabetes. She began having symptoms 5 days ago with sore throat, cough, sinus drainage and subjective fevers. On Tuesday she saw her primary care doctor, had a negative strep and negative flu test, and was given levofloxacin. The cough and sore throat improved somewhat, but do persist. One day later she woke up with some mild pain in her right ankle and then increasing swelling over 24 to 48 period. She presented to the Emergency Room yesterday where her foot was noted to be hot and swollen. Aspiration was done with negative crystals but gram-positive cocci noted on Gram stain. Started on vancomycin.   Since admission she actually feels like the leg is improving with less pain and some increase in mobility. She was seen by Dr. Marry Guan who consulted regarding further management.   PAST MEDICAL HISTORY: 1. Coronary disease, status post bypass.  2. Right breast cancer status post surgery and radiation, in 2007.  3. Hypertension.  4. Diabetes, relatively well controlled.  5. AAA, follows with Dr. Delana Meyer.   PAST SURGICAL HISTORY:  1. Left ankle fracture repair.  2. Partial colectomy from intestinal obstruction.  3. Ventral hernia repair.  4. Right breast lumpectomy.  5. Hysterectomy.  6. Coronary artery bypass graft.   ALLERGIES: She is allergic to a pain medicine which she does not know the name of.  CURRENT MEDICATIONS: Currently she is on vancomycin as an antibiotic. She also is on  Colace, lisinopril, metoprolol, ofloxacin otic drops, simvastatin, sliding scale insulin and Tylenol p.r.n.   SOCIAL HISTORY: The patient lives by herself. Her family is very involved in her care. She is very active. She does not smoke or drink.   FAMILY HISTORY: Mother with diabetes. Father had aortic aneurysm.   REVIEW OF SYSTEMS: Eleven systems are negative except as per HPI.  PHYSICAL EXAMINATION: GENERAL: The patient is a pleasant, interactive and thin female in no acute distress.  VITAL SIGNS: Temperature max over the last 48 hours is 98.3, pulse 67, blood pressure 129/73 and sats 96% on room air.  HEENT: Pupils are equal, round and reactive to light and accommodation. Extraocular movements are intact. Sclerae anicteric. No conjunctival hemorrhage. Oropharynx is clear. NECK: Supple. No anterior cervical, posterior cervical or supraclavicular lymphadenopathy.  HEART: Regular with a II/Sonya Hoover systolic murmur over the left lower sternal border. She has well-healed coronary artery bypass graft sternal incision.  LUNGS: She does have some bronchial breath sounds on the left.  ABDOMEN: Soft, nontender and nondistended. No hepatosplenomegaly.  EXTREMITIES: No on the left leg. On the right ankle she has 1+ edema. She has some mild pain with range of motion. She has some slight limitation in her ability to dorsiflex. There is a scab where I and D had been performed. The leg is hot but not very erythematous.  NEUROLOGIC: Alert and oriented x 3. Grossly nonfocal neuro exam. DERM: No peripheral stigmata of endocarditis.  DATA: White blood count on the day of admission was 10.7, hemoglobin 12.7 and platelets 248. BUN was 16, creatinine 1.25,  sodium 132, alkaline phosphatase 144 and glucose 246. Hemoglobin A1c is 7.3. ESR is elevated at 81. Uric acid is 6.9.   Blood cultures x 2 negative. Gram stain of aspirate showed gram-positive cocci. It does not note if there are chains or clusters. Crystal analysis  for fluid was negative.   X-ray of the foot was negative for any acute findings.   IMPRESSION: A pleasant 80 year old relatively active woman presenting with upper respiratory infection for the last 5 days, started on antibiotics and then developed right ankle pain, tenderness. She had subjective fevers and also had markedly elevated ESR, but normal white count. She is status post aspiration with joint analysis negative for crystals, but positive for gram-positive cocci. She has been on vancomycin and levofloxacin and has had some improvement in symptoms over the last 24 hours.   I suspect she may have had Streptococcus pneumonia and then developed bacteremia and seeded this joint. Other possibilities would include Staphylococcus aureus hematogenous seeding of the joint, although she has no other obvious source for bacteremia. The ESR is elevated, although she had a normal white count. She seems stable now with no fevers and some improvement after the drainage and the antibiotics.   RECOMMENDATIONS: 1. At this point, I would continue the vancomycin and levofloxacin until we have identification of the organism. It is possible nothing will grow since she has been on levofloxacin.  2. I will check a chest x-ray to evaluate for underlying pneumonia as the source.  3. I think she will likely need at least 1 to 2 weeks of IV antibiotic based on organism identified. Vancomycin would be appropriate for this. She may or may need further arthrocentesis for drainage or open drainage, although the site does seem to be improving.  4. I have left a message with Dr. Marry Guan and will discuss further with him.   Thank you for the consult. I will be glad to follow with you.   TOTAL TIME SPENT: 40 minutes. ____________________________ Cheral Marker. Ola Spurr, MD dpf:sb D: 03/27/2012 13:00:53 ET T: 03/27/2012 13:35:20 ET JOB#: [id number]  cc: Cheral Marker. Ola Spurr, MD, <Dictator> Warnie Belair Ola Spurr MD ELECTRONICALLY SIGNED  03/29/2012 21:56

## 2014-06-10 NOTE — Consult Note (Signed)
Brief Consult Note: Diagnosis: right ankle septic arthritis, CAD s/o CABG, HTN and DM.   Patient was seen by consultant.   Consult note dictated.   Recommend to proceed with surgery or procedure.   Orders entered.   Discussed with Attending MD.   Comments: 80y/o F with PMH of CAD s/p CABG in 2006, HTN and DM came in for right ankle pain, welling adn erythema. aspiration IN ER revelaing GPC per micro  * Pre op clearance- for right ankle I&D and possible arthoscopic procedure EKG ordered, labs seen. no active cardiac symtpoms, if EKG look ok, can proceed with procedure cont toprol once meds verified  * CAD s/p CABG- in 2006, no active symptoms recently, asa held  EKG, cont home meds  * HTN- toprol, lisinopril, hold hctz as hyponatremia, iv hydralazine prn  * DM- borderlin , hba1c, on a study drug  * Recent bronchitis- cont levaquin for 5 more days for now  * Right ankle septic arthitis- unknwon trigger, ID consult, mgmt per ortho, will start vanc as GPC per micro.  Electronic Signatures: Enid BaasKalisetti, Aara Jacquot (MD)  (Signed 06-Feb-14 18:47)  Authored: Brief Consult Note   Last Updated: 06-Feb-14 18:47 by Enid BaasKalisetti, Ilyssa Grennan (MD)

## 2014-06-10 NOTE — Consult Note (Signed)
Brief Consult Note: Diagnosis: Septic arthritis following URI and possible PNA.   Patient was seen by consultant.   Consult note dictated.   Recommend to proceed with surgery or procedure.   Recommend further assessment or treatment.   Orders entered.   Comments: Possible Strep PNA or Staph aureus will cont vanco with goal trough 15-20 Cont levofloxacin Await cx result Consider repeat aspiration or surg but has had some clinical improvment May need 7-14 days IV abx followed by orals but would cx result before proceeding with PICC line. Thank you for consult.  WIll be glad to follow with you/.  Electronic Signatures: Dierdre HarnessFitzgerald, Jayvin Hurrell Patrick (MD)  (Signed 07-Feb-14 13:03)  Authored: Brief Consult Note   Last Updated: 07-Feb-14 13:03 by Dierdre HarnessFitzgerald, Mykenna Viele Patrick (MD)

## 2014-06-10 NOTE — Consult Note (Signed)
PATIENT NAME:  Sonya Hoover, Sonya Hoover MR#:  [id number] DATE OF BIRTH:  [birth year]  DATE OF CONSULTATION:  03/26/2012  CONSULTING PHYSICIAN:  Gladstone Lighter, MD  ADMITTING PHYSICIAN:  Dr. Marry Guan.  PRIMARY CARE PHYSICIAN:  Dr. Kary Kos.   PRIMARY ONCOLOGIST:  Dr. Inez Pilgrim.   REASON FOR CONSULTATION:  Preop clearance for septic arthritis surgery.   BRIEF HISTORY OF PRESENT ILLNESS:  The patient is a very pleasant 80 year old Caucasian female with past medical history of T1 N1 breast cancer status post lumpectomy and radiation treatment, history of coronary artery disease status post bypass graft surgery, diabetes, hypertension, who was doing fine up until last week when she developed upper respiratory tract infection symptoms and has seen her PCP.  She was diagnosed with acute bronchitis and was given a shot of penicillin and also started on Levaquin as an outpatient.  Her sore throat, fever have been improving, but yesterday morning when she woke up she noticed that her right foot was swollen at the ankle with edema and erythema spreading on up to the middle 2 toes.  She was able to put some weight on the leg, but still had severe pain.  She called her daughter.  They wanted to wait for one more day and things did not get better this morning and she is unable to even put any weight on that leg so came to the ER.  Because of the joint effusion, the ER physician aspirated some fluid and sent for crystals.  The fluid amount was not enough to find any crystals, but the lab called back saying that they saw a lot of gram-positive cocci.  So orthopedics was consulted for admission for possible septic arthritis and medicine consult has been requested for preop clearance.   The patient denies any recent cardiac symptoms.  No chest pain.  No difficulty breathing other than recent bronchitis which is improving at this time.  She is very active and is still currently working at this time.  Her bypass surgery was in 2006 and  has not had further symptoms after that.   PAST MEDICAL HISTORY:   1.  Coronary artery disease status post bypass graft surgery.  2.  History of right breast cancer, status post lumpectomy and radiation therapy in 2007 and finished 5 years of Femara.  3.  Hypertension.  4.  Diabetes mellitus, on a study drug.  5.  Abdominal aortic aneurysm, size has been stable for several years and follows with Dr. Delana Meyer as an outpatient.   PAST SURGICAL HISTORY:  1.  Left ankle fracture repair after mechanical trauma.  2.  Partial colectomy done for intestinal obstruction.  3.  Ventral hernia repair.  4.  Right breast lumpectomy.  5.  Hysterectomy.  6.  Coronary artery bypass graft surgery.   ALLERGIES TO MEDICATIONS:  No known drug allergies.   HOME MEDICATIONS: 1.  Aspirin 325 mg by mouth daily.  2.  Benzonatate 100 mg by mouth q. 8 hours as needed for cough started on 03/24/2012.  3.  HCTZ triamterene 25 mg/37.5 mg 1 capsule by mouth daily.  4.  Levaquin 750 mg by mouth daily for 7 days, started on 03/24/2012.  5.  Lisinopril 10 mg by mouth daily.  6.  Metoprolol 50 mg by mouth twice daily.  7.  Zocor 40 mg by mouth daily.   SOCIAL HISTORY:  Lives at home by herself as mentioned above, very active and still working.  No history of any smoking or alcohol use.  FAMILY HISTORY:  Mom had diabetes and dad with aortic aneurysm.  No history of any cancer in the family.   REVIEW OF SYSTEMS:  CONSTITUTIONAL:  Positive for low grade fever and chills at home.  No fatigue or weakness.  EYES:  No blurred vision, double vision, glaucoma or cataracts.  EARS, NOSE, THROAT:  Positive for right ear pain from recent congestion and ear infection.  No tinnitus.  Positive for mild hearing loss on the right side.  No epistaxis or discharge.  RESPIRATORY:  Positive for cough.  No difficulty breathing.  No history of COPD or wheezing.  CARDIOVASCULAR:  No chest pain, orthopnea, edema, arrhythmia, palpitations or  syncope.  GASTROINTESTINAL: No nausea, vomiting, abdominal pain, diarrhea or melena.  GENITOURINARY:  No dysuria, hematuria, renal calculus, frequency or incontinence.  ENDOCRINE:  No polyuria, nocturia, thyroid problems, heat or cold intolerance.  HEMATOLOGY:  No anemia, easy bruising or bleeding.  SKIN:  No acne, rash or lesions.  MUSCULOSKELETAL:  Positive for right ankle pain and swelling.   NEUROLOGIC:  No numbness, weakness, CVA, TIA or seizures.  PSYCHOLOGICAL:  No anxiety, insomnia, depression.   PHYSICAL EXAMINATION: VITAL SIGNS:  Temperature 98.2 degrees Fahrenheit, pulse 107, respirations 18, blood pressure 178/75, pulse ox is 96% on room air.  GENERAL:  Well-built, well-nourished female lying in bed, not in any acute distress.  HEENT:  Normocephalic, atraumatic.  Pupils equal, round, reacting to light.  Anicteric sclerae.  Extraocular movements intact.  Oropharynx clear without erythema, mass or exudates.  The right ear shows opaqueness of the eardrum with middle ear infection and the left tympanic membrane looks normal.  NECK:  Supple.  No thyromegaly, JVD or carotid bruits.  LUNGS:  Clear to auscultation bilaterally.  No wheeze or crackles.  No use of accessory muscles for breathing.  CARDIOVASCULAR:  S1, S2, regular rate and rhythm.  No murmurs, rubs or gallops.  ABDOMEN:  Soft, nontender, nondistended.  No hepatosplenomegaly.  Normal bowel sounds.  EXTREMITIES:  The left leg, no pedal edema.  Normal dorsalis pedis pulses on that side.  The right ankle and the dorsum of the foot are swollen with erythema, extreme tenderness.  Dorsalis pedis pulse is palpable.  SKIN:  No acne, rash or lesions.  LYMPHATICS:  No cervical lymphadenopathy.  NEUROLOGIC:  Cranial nerves intact.  No focal motor or sensory deficits.  PSYCHOLOGICAL:  The patient is awake, alert, oriented x 3.   LABORATORY DATA:  WBC 10.7, hemoglobin 12.7, hematocrit 37.4, platelet count 248.  Sodium 132, potassium 3.7,  chloride 99, bicarb 26, BUN 16, creatinine 1.25, glucose 246 and calcium of 9.5.  ALT is 30, AST 45, alk phos 144, total bilirubin 0.5 and albumin of 3.4.  Uric acid elevated at 6.9 and ESR is 81.  Right foot x-ray showing no fracture.  There are no acute findings seen.  No crystals are seen.  EKG is pending at this time.   RECOMMENDATIONS:  The patient is a 80 year old female with history of coronary artery disease status post bypass graft surgery, hypertension, diabetes, admitted for right ankle pain, swelling and erythema.  Aspiration in the ER revealing gram-positive cocci per micro lab.  1.  Preop clearance for right ankle incision and drainage and possible arthroscopic procedure.  EKG ordered and labs have been looked at.  No active cardiac symptoms.  Can proceed with the procedure.  Continue peri-op beta-blocker.  2.  Coronary artery disease status post bypass graft surgery.  No active symptoms recently.  Aspirin is on hold.  Continue home medications.  3.  Hypertension.  Continue Toprol and lisinopril. discontinue HCTZ secondary to borderline hyponatremia at this time and add IV hydralazine as needed for elevated blood pressure.  4.  Diabetes mellitus.  Check hemoglobin A1c and start on sliding scale insulin while in the hospital.  As an outpatient, patient is on the study drug.  5.  Recent bronchitis and ear infection.  Continue Levaquin and ear antibiotics.  6.  Right ankle septic arthritis.  ID consult, management per ortho and arthroscopic procedure probably.  We will start vancomycin as gram-positive cocci has been seen per micro lab.    Thank you for allowing me to participate in the care of the patient.   TIME SPENT IN CONSULTATION:  Is 50 minutes.      ____________________________ Gladstone Lighter, MD rk:ea D: 03/26/2012 19:06:35 ET T: 03/26/2012 23:30:09 ET JOB#: [id number]  cc: Gladstone Lighter, MD, <Dictator> Dr. Susy Frizzle MD ELECTRONICALLY SIGNED  04/15/2012 15:58

## 2014-06-10 NOTE — Discharge Summary (Signed)
PATIENT NAME:  Sonya Hoover, Sonya Hoover MR#:  [id number] DATE OF BIRTH:  [birth year]  DATE OF ADMISSION:  03/26/2012 DATE OF DISCHARGE:    ADMITTING DIAGNOSIS: Cellulitis right ankle versus septic joint.  CONSULTATIONS:  [facility], Dr. Nemiah CommanderKalisetti, Dr. Sampson GoonFitzgerald infectious disease.   HISTORY OF PRESENT ILLNESS: The patient is a 80 year old female who reported spontaneous onset of right foot and ankle pain 1 day prior to admission. She denied any trauma or aggravating event. She had noticed some redness and swelling that increased overnight. The pain had increased to the point that she was having difficulty ambulating and was unable to bear weight on the right lower extremity. She had been treated earlier in the week prior to admission for bronchitis as well as a right ear infection. She had been receiving penicillin and Levaquin for this. She states that she felt bad on the week of admission but that it was related to her ear and upper respiratory issues. She subsequently presented to the Emergency Room where the Emergency Room doctor attempted an aspiration of the joint. This was sent for a preliminary Gram stain and apparently demonstrated rare GPC. There was no anaerobic or aerobic done. There was no Gram stain performed after further research but it did have crystals analysis performed which did not reveal any. She had no history of any gout.  A medical consultation was ordered to help with the medical treatment during her hospital care. While in the hospital also Dr. Sampson GoonFitzgerald of infectious diseases was consulted. She was placed on vancomycin IV 1000 mg q.24 hours as well as Levaquin 500 mg daily. She was also continued with her Floxin 0.3% optic drops to the right ear b.i.d.  The patient's right foot has improved significantly with the IV antibiotics and elevating and icing. He was able to begin moving her toes on day 2.  By the day of discharge she was ambulating 200 feet. She was independent with bed to chair  transfers.   Was told very well.   The patient's vital signs have been stable. She has been afebrile. Hemodynamically she is stable. No transfusions were given. Overall, it has been an unremarkable hospital.   The patient did receive a PICC line on 03/31/2012 to continue with IV antibiotics. No complications were encountered.   DISPOSITION: The patient is being discharged to home in improved stable condition.   DISCHARGE INSTRUCTIONS:  She will continue with IV vancomycin 1000 mg daily for 14 days and then discontinue. She is also to continue with the Levaquin 500 mg daily for 2 weeks.  She has a follow-up with Dr. Sampson GoonFitzgerald in 2 weeks. [facility]   DISCHARGE INSTRUCTIONS: 1.  She may weight bear as tolerated.  2.  Follow up with the medical doctors in regards to upper respiratory infection or complications such as the ear. 3.  She is placed on a regular diet.  4.  She is to call the clinic for any temperatures of 101.5 or greater or worsening of her condition. 5.  She will receive home health nursing the IV antibiotic.  6.  She is to resume her regular medication that she was on prior to admission.  7.  She was given prescription for the vancomycin, Levaquin, Floxin as well as some Norco.   PAST MEDICAL HISTORY:   Intracranial hemorrhage December 2012,  dyslipidemia diabetes,  aortic aneurysm, thyroid nodule, hypertension, breast cancer, ORIF of a left ankle fracture, lumpectomy, hernia  repair, hysterectomy, coronary artery bypass graft.    ____________________________ Van Clines, PA jrw:ct D: 03/31/2012 08:14:11 ET T: 03/31/2012 08:47:05 ET JOB#: [id number]  cc: Van Clines, PA, <Dictator> Illene Labrador. Angie Fava., MD JON [facility] ELECTRONICALLY SIGNED 04/05/2012 17:36

## 2014-06-12 NOTE — Discharge Summary (Signed)
PATIENT NAME:  Sonya Hoover, Sonya Hoover MR#:  [id number] DATE OF BIRTH:  Dec 29, 1934  DATE OF ADMISSION:  02/01/2011 DATE OF DISCHARGE:  02/05/2011  ADMITTING DIAGNOSIS: Left ankle trimalleolar fracture.   DISCHARGE DIAGNOSIS: Left ankle trimalleolar fracture, status post open reduction and internal fixation.   ATTENDING: Kennedy Bucker, MD, [facility].   PRIMARY CARE PHYSICIAN: Rhona Leavens. Burnett Sheng, MD  CONSULTING PHYSICIANS:  1. Dr. Marcina Millard. 2. Dr. Adrian Saran. 3. Dr. Darrick Meigs.    PROCEDURE: On 02/02/2011, the patient underwent ORIF of left ankle trimalleolar fracture by Dr. Rosita Kea. Anesthesia was spinal. Estimated blood loss was 100 mL. Operative findings included a good reduction. Implants were by [employer]. There were no drains placed. No specimens were sent. No tourniquet time. No complications occurred.   PATIENT HISTORY:  Sonya Hoover is a 80 year old who suffered a fall at home going up the steps. She slipped, had her foot go backwards, and was brought to the ER where she was found to have a left ankle fracture-dislocation. She was admitted for treatment of this.   PAST MEDICAL HISTORY:  1. Coronary artery disease with CABG at [facility] in 2006. 2. Hypertension.  3. Hyperlipidemia.  4. Diabetes. 5. Abdominal aortic aneurysm.  6. Previous right shoulder cuff tear and dislocation, still with weakness.   ALLERGIES: No known drug allergies.  PHYSICAL EXAMINATION: GENERAL: A slender white female who appears younger than her stated age, in moderate distress. HEART: Regular rate and rhythm. No murmur. LUNGS: Clear. EXTREMITIES: Positive left ankle deformity. No tenting of the skin. She is able to flex and extend the toes with pain. Sensation is intact.   RADIOLOGICAL DATA: X-rays and CT scan were obtained. They showed a significant trimalleolar ankle fracture-dislocation with posterior dislocation of the ankle and large bony fragments involving the joint. This was discussed at  length with the patient.   HOSPITAL COURSE: The patient was admitted on the 14th because of the aforementioned fracture. Preop medical evaluation and consultation had been obtained. The patient had undergone her procedure without complication on December 15th and was transferred to the PACU and the Orthopedic floor in stable condition. She was treated with TED hose and aspirin afterwards for deep vein thrombosis prophylaxis. She would tolerate her diet well and passed some stool on the 17th. She was a bit hesitant to do physical therapy. She was somewhat self-limiting. She does have a profound right shoulder weakness, though, and history of left wrist fracture. The wrist fracture has healed. She did do transfers and therapeutic exercise in bed. She is afraid of re-breaking her left ankle. Her dressing had been changed on the 18th, and her incision sites were found to be clean and intact with staples and no sign of infection. She was fitted with a left short leg cast for support. The Foley catheter was removed in good time.   CONDITION AT DISCHARGE: Stable.   DISPOSITION: Rehab facility.   DISCHARGE MEDICATIONS:  1. Norco 5/325 mg, 1 to 2 every 4 hours as needed for pain.  2. Lisinopril 10 mg oral daily. 3. Lopressor 50 mg oral every 12 hours.  4. HCTZ/triamterene 25/37.5 mg tablet, 1 oral daily.  5. Letrozole  tablet 2.5 mg oral daily.  6. Insulin sliding scale Novolin R injection.  7. Zetia 10 mg oral daily. 8. Zocor 40 mg oral at bedtime.  9. Surfak 240 mg oral daily. 10. Aspirin 325 mg oral daily. 11. Ambien 10 mg oral at bedtime as needed for insomnia. 12. Benadryl  25 mg oral every 6 hours as needed for itching. 13. Metamucil SF powder, one packet oral at bedtime. 14. Colace 100 mg oral b.i.d.   15. Senokot 1 tablet oral daily. 16. Os-Cal 500/vitamin D 200 units, 1 tablet oral b.i.d. with meals.  17. Celebrex 200 mg daily as needed for pain.  DISCHARGE INSTRUCTIONS AND FOLLOW-UP:   1. Physical Therapy and Occupational Therapy consult. She is nonweightbearing on the left leg. She also has some profound right shoulder weakness.  2. Carbohydrate-controlled diet, 2000 calorie.  3. TED hose, knee-high, are to be worn daily on the right leg for 6 weeks following her procedure.  4. Universal skin precautions.  5. Try to elevate her left lower extremity on a pillow as much as possible. Encourage movement of her toes.  6. The patient has a follow-up appointment [facility] in [place] on 02/18/2011 at 9:30 a.m. with Sonya GrayerJonathan Lilias Lorensen, Sonya Hoover. If there are any questions, please call our office.   ____________________________ Sonya MoynahanJonathan R. [place] Hoover, Sonya Hoover D: 02/05/2011 10:23:06 ET T: 02/05/2011 10:32:34 ET JOB#: [id number]  cc: Sonya MoynahanJonathan R. Clyde CanterburyPrentice, Sonya, <Dictator> Sonya MoynahanJONATHAN R Latika Kronick Hoover ELECTRONICALLY SIGNED 02/19/2011 9:24

## 2015-01-19 DEATH — deceased
